# Patient Record
Sex: Male | Born: 1990 | Race: White | Hispanic: No | Marital: Married
Health system: Midwestern US, Community
[De-identification: ages and names within clinical notes are randomized; demographics above are authoritative.]

## PROBLEM LIST (undated history)

## (undated) HISTORY — PX: PELVIC FRACTURE SURGERY: SHX119

---
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
## 2014-12-09 ENCOUNTER — Inpatient Hospital Stay
Admit: 2014-12-09 | Discharge: 2014-12-10 | Disposition: A | Discharge status: Home / Self Care | Payer: BLUE CROSS/BLUE SHIELD | Attending: Emergency Medicine

## 2014-12-09 DIAGNOSIS — S39012A Strain of muscle, fascia and tendon of lower back, initial encounter: Secondary | ICD-10-CM

## 2014-12-09 MED ORDER — IBUPROFEN 600 MG TAB
600 mg | ORAL_TABLET | Freq: Four times a day (QID) | ORAL | Status: AC | PRN
Start: 2014-12-09 — End: ?

## 2014-12-09 MED ORDER — KETOROLAC TROMETHAMINE 60 MG/2 ML IM
60 mg/2 mL | INTRAMUSCULAR | Status: AC
Start: 2014-12-09 — End: 2014-12-09
  Administered 2014-12-09: 23:00:00 via INTRAMUSCULAR

## 2014-12-09 MED ORDER — CHLORZOXAZONE 500 MG TAB
500 mg | ORAL_TABLET | Freq: Three times a day (TID) | ORAL | Status: AC | PRN
Start: 2014-12-09 — End: ?

## 2014-12-09 MED ORDER — OXYCODONE-ACETAMINOPHEN 5 MG-325 MG TAB
5-325 mg | ORAL_TABLET | ORAL | Status: AC | PRN
Start: 2014-12-09 — End: ?

## 2014-12-09 MED ORDER — DIAZEPAM 5 MG/ML SYRINGE
5 mg/mL | INTRAMUSCULAR | Status: AC
Start: 2014-12-09 — End: 2014-12-09
  Administered 2014-12-09: 23:00:00 via INTRAMUSCULAR

## 2014-12-09 MED ORDER — HYDROMORPHONE 2 MG/ML INJECTION SOLUTION
2 mg/mL | INTRAMUSCULAR | Status: AC
Start: 2014-12-09 — End: 2014-12-09
  Administered 2014-12-09: 23:00:00 via INTRAMUSCULAR

## 2014-12-09 MED ORDER — ONDANSETRON 4 MG TAB, RAPID DISSOLVE
4 mg | ORAL | Status: AC
Start: 2014-12-09 — End: 2014-12-09
  Administered 2014-12-09: 23:00:00 via ORAL

## 2014-12-09 MED FILL — KETOROLAC TROMETHAMINE 60 MG/2 ML IM: 60 mg/2 mL | INTRAMUSCULAR | Qty: 2

## 2014-12-09 MED FILL — DIAZEPAM 5 MG/ML SYRINGE: 5 mg/mL | INTRAMUSCULAR | Qty: 2

## 2014-12-09 MED FILL — HYDROMORPHONE 2 MG/ML INJECTION SOLUTION: 2 mg/mL | INTRAMUSCULAR | Qty: 1

## 2014-12-09 MED FILL — ONDANSETRON 4 MG TAB, RAPID DISSOLVE: 4 mg | ORAL | Qty: 1

## 2014-12-09 NOTE — ED Notes (Signed)
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 No pmh back problems.  [place]

## 2014-12-09 NOTE — ED Notes (Signed)
Sepsis Screening completed    (  )Patient meets SIRS criteria.  ( x )Patient does not meet SIRS criteria.      SIRS Criteria is achieved when two or more of the following are present  ? Temperature < 96.8??F (36??C) or > 100.9??F (38.3??C)  ? Heart Rate > 90 beats per minute  ? Respiratory Rate > 20 beats per minute  ? WBC count > 12,000 or <4,000 or > 10% bands

## 2014-12-09 NOTE — ED Notes (Signed)
Pt hourly rounding competed.  Safety   Pt (x) resting on stretcher with side rails up and call bell in reach.    () in chair    () in parents arms.  Toileting   Pt offered ()Bedpan     ()Assistance to Restroom     ()Urinal  Ongoing Updates  Updated on plan of care and status of test results.  Pain Management  Inquired as to comfort and offered comfort measures:    () warm blankets   () dimmed lights

## 2014-12-09 NOTE — ED Notes (Signed)
Assumed care of pt at this time.

## 2014-12-09 NOTE — ED Notes (Signed)
Patient armband removed and shredded  I have reviewed discharge instructions with the patient.  The patient verbalized understanding.

## 2014-12-09 NOTE — ED Provider Notes (Signed)
HPI Comments: 6:16 PM   Mathew Wall is a 24 y.o. male presenting to the ED c/o constant, aching, lower back pain onset between 0800 and 0900 rated 8/10. 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 This morning he picked up a steel rod and felt a pulling sensation. Reports he was able to complete his day at work but his pain has worsened throughout the day. Pain worse with movement. Pt has tried cold compress with no relief. Reports hx of orthopaedic surgery due to fx pelvis, with screws in his pelvis. Denies hx of back pain, radiation of pain, bowel or urinary incontinence, any other injuries, and any other sxs or complaints.     Patient is a 24 y.o. male presenting with back pain. The history is provided by the patient.   Back Pain   This is a new problem. The current episode started 6 to 12 hours ago. The problem has been gradually worsening. The problem occurs constantly. The pain is associated with lifting. The pain is present in the lower back. The quality of the pain is described as aching (pulling). The pain does not radiate. The pain is at a severity of 8/10. The symptoms are aggravated by bending and twisting. Pertinent negatives include no fever, no numbness, no abdominal pain, no bowel incontinence, no bladder incontinence, no pelvic pain, no leg pain and no weakness. He has tried ice for the symptoms. The treatment provided no relief.        History reviewed. No pertinent past medical history.    Past Surgical History:   Procedure Laterality Date   ??? Hx orthopaedic       fx pelvis - screws in pelvis         History reviewed. No pertinent family history.    History     Social History   ??? Marital Status: MARRIED     Spouse Name: N/A   ??? Number of Children: N/A   ??? Years of Education: N/A     Occupational History   ??? Not on file.     Social History Main Topics   ??? Smoking status: Current Every Day Smoker   ??? Smokeless tobacco: Not on file    ??? Alcohol Use: No   ??? Drug Use: No   ??? Sexual Activity: Not on file     Other Topics Concern   ??? Not on file     Social History Narrative   ??? No narrative on file         ALLERGIES: Lovenox and Penicillins    Review of Systems   Constitutional: Negative for fever and chills.   Gastrointestinal: Negative for nausea, vomiting, abdominal pain and bowel incontinence.   Genitourinary: Negative for bladder incontinence, difficulty urinating and pelvic pain.   Musculoskeletal: Positive for back pain.   Skin: Negative for color change and rash.   Neurological: Negative for weakness and numbness.       Filed Vitals:    12/09/14 1742 12/09/14 1911 12/09/14 1922   BP: 144/75 116/71 117/77   Pulse: 87 64 66   Temp: 98.1 ??F (36.7 ??C)     Resp: Height:  (1.778 m)     Weight: 77.111 kg (170 lb)     SpO2: 98% 100% 100%            Physical Exam   Constitutional: He is oriented to person, place,  and time. He appears well-developed and well-nourished. No distress.   Caucasian male in NAD. Alert. Appears uncomfortable with movement.    HENT:   Head: Normocephalic and atraumatic.   Right Ear: External ear normal.   Left Ear: External ear normal.   Nose: Nose normal.   Eyes: Conjunctivae are normal. Right eye exhibits no discharge. Left eye exhibits no discharge.   Neck: Normal range of motion.   Cardiovascular: Normal rate, regular rhythm, normal heart sounds and intact distal pulses.  Exam reveals no gallop and no friction rub.    No murmur heard.  Pulmonary/Chest: Effort normal and breath sounds normal. No accessory muscle usage. No tachypnea. No respiratory distress. He has no decreased breath sounds. He has no wheezes. He has no rhonchi. He has no rales.   Musculoskeletal:        Lumbar back: He exhibits decreased range of motion (secondary to pain), tenderness (no midline spinal tenderness, TTP at bilateral lumbar paraspinal muscles), pain and spasm. He exhibits no swelling and no deformity (no step off).         Back:    Neurological: He is alert and oriented to person, place, and time.   Skin: Skin is warm and dry. He is not diaphoretic.   Psychiatric: He has a normal mood and affect. Judgment normal.   Nursing note and vitals reviewed.       RESULTS:    No orders to display        Labs Reviewed - No data to display    No results found for this or any previous visit (from the past 12 hour(s)).    MDM  Number of Diagnoses or Management Options  Low back strain, initial encounter:   Diagnosis management comments: CC of back pain that is reproducible on exam w/ movement/ROM/palpation. No abdominal complaints/abdomen non tender on exam. No pulsatile mass appreciated. Normal neurologic exam. Not immunosupressed. Doubt epidural abscess, infection, neoplastic etiology. Not consistent with cauda equina. No trauma or midline tenderness. Doubt fracture. Most c/w musculoskeletal etiology. The patient shows no signs or symptoms of developing complication requiring inpatient treatment or management. Further laboratory or radiological investigation is not indicated. Will treat symptomatically with return immediately for new or worsening symptoms. The importance of close follow-up with PMD emphasized to patient.        MEDICATIONS GIVEN:  Medications   ketorolac tromethamine (TORADOL) 60 mg/2 mL injection 60 mg (60 mg IntraMUSCular Given 12/09/14 1838)   diazepam (VALIUM) injection 5 mg (5 mg IntraMUSCular Given 12/09/14 1839)   HYDROmorphone (DILAUDID) injection 2 mg (2 mg IntraMUSCular Given 12/09/14 1912)   ondansetron (ZOFRAN ODT) tablet 4 mg (4 mg Oral Given 12/09/14 1838)       Procedures    PROGRESS NOTE:  6:16 PM  Initial assessment performed.    PROGRESS NOTE:   7:26 PM  Pt has been re-examined by Cassandria Anger, PA-C. Pt's pain has improved, he is listening to music. Will d/c home.     See MDM above. Reasons to RTED discussed with pt. All questions answered. Pt feels comfortable going home at this time. Pt expressed understanding  and he agrees with plan.      DISCHARGE NOTE:  7:29 PM   Mathew Wall's  results have been reviewed with him.  He has been counseled regarding his diagnosis, treatment, and plan.  He verbally conveys understanding and agreement of the signs, symptoms, diagnosis, treatment and prognosis and additionally agrees to follow up as discussed.  He also agrees with the care-plan and conveys that all of his questions have been answered.  I have also provided discharge instructions for him that include: educational information regarding their diagnosis and treatment, and list of reasons why they would want to return to the ED prior to their follow-up appointment, should his condition change. The patient has been provided with education for proper Emergency Department utilization.    CLINICAL IMPRESSION:    1. Low back strain, initial encounter        PLAN: DISCHARGE HOME    Follow-up Information     Follow up With Details Comments Contact Info    Esperanza Richters, DO Schedule an appointment as soon as possible for a visit in 2 days As needed, for follow up after ED visit [address] [place] [address]  [phone]      [facility] EMERGENCY DEPT Go to As needed, If symptoms worsen [address]  [place] [address]  [phone]          Current Discharge Medication List      START taking these medications    Details   ibuprofen (MOTRIN) 600 mg tablet Take 1 Tab by mouth every six (6) hours as needed for Pain. Take with food.  Qty: 20 Tab, Refills: 0      chlorzoxazone (PARAFON FORTE DSC) 500 mg tablet Take 1 Tab by mouth three (3) times daily as needed for Muscle Spasm(s). Indications: Please do not drive while taking this medication.  Qty: 21 Tab, Refills: 0      oxyCODONE-acetaminophen (PERCOCET) 5-325 mg per tablet Take 1 Tab by mouth every four (4) hours as needed for Pain. Max Daily Amount: 6 Tabs. Indications: Please do not drive while taking this medication.  Qty: 20 Tab, Refills: 0                  SCRIBE ATTESTATION STATEMENT  Documented [id number] Manson Passey, scribing for and in the presence of Cassandria Anger, PA-C.     PROVIDER ATTESTATION STATEMENT  I personally performed the services described in the documentation, reviewed the documentation, as recorded by the scribe in my presence, and it accurately and completely records my words and actions.  Cassandria Anger, PA-C.

## 2014-12-12 ENCOUNTER — Emergency Department (HOSPITAL_BASED_OUTPATIENT_CLINIC_OR_DEPARTMENT_OTHER)
Admission: EM | Admit: 2014-12-12 | Discharge: 2014-12-12 | Disposition: A | Discharge status: Home / Self Care | Payer: BLUE CROSS/BLUE SHIELD | Attending: Emergency Medicine | Admitting: Emergency Medicine

## 2014-12-12 ENCOUNTER — Encounter (HOSPITAL_BASED_OUTPATIENT_CLINIC_OR_DEPARTMENT_OTHER): Payer: Self-pay | Admitting: *Deleted

## 2014-12-12 DIAGNOSIS — M545 Low back pain, unspecified: Secondary | ICD-10-CM

## 2014-12-12 DIAGNOSIS — M549 Dorsalgia, unspecified: Secondary | ICD-10-CM

## 2014-12-12 DIAGNOSIS — Z720 Tobacco use: Secondary | ICD-10-CM | POA: Diagnosis not present

## 2014-12-12 DIAGNOSIS — Z880 Allergy status to penicillin: Secondary | ICD-10-CM | POA: Diagnosis not present

## 2014-12-12 DIAGNOSIS — M62830 Muscle spasm of back: Secondary | ICD-10-CM | POA: Insufficient documentation

## 2014-12-12 MED ORDER — HYDROCODONE-ACETAMINOPHEN 5-325 MG PO TABS: 1.0000 | ORAL_TABLET | Freq: Four times a day (QID) | ORAL | Status: DC | PRN

## 2014-12-12 MED ORDER — NAPROXEN 500 MG PO TABS: 500.0000 mg | ORAL_TABLET | Freq: Two times a day (BID) | ORAL | Status: DC

## 2014-12-12 MED ORDER — HYDROMORPHONE HCL 1 MG/ML IJ SOLN
1.0000 mg | Freq: Once | INTRAMUSCULAR | Status: DC
  Filled 2014-12-12: qty 1

## 2014-12-12 MED ORDER — KETOROLAC TROMETHAMINE 60 MG/2ML IM SOLN
60.0000 mg | Freq: Once | INTRAMUSCULAR | Status: AC
  Administered 2014-12-12: 60 mg via INTRAMUSCULAR
  Filled 2014-12-12: qty 2

## 2014-12-12 MED ORDER — CYCLOBENZAPRINE HCL 10 MG PO TABS: 10.0000 mg | ORAL_TABLET | Freq: Two times a day (BID) | ORAL | Status: DC | PRN

## 2014-12-12 MED ORDER — CYCLOBENZAPRINE HCL 10 MG PO TABS
10.0000 mg | ORAL_TABLET | Freq: Once | ORAL | Status: DC
  Filled 2014-12-12: qty 1

## 2014-12-12 NOTE — Discharge Instructions (Signed)

## 2014-12-12 NOTE — ED Notes (Signed)
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 C/o low bil back pain.

## 2014-12-12 NOTE — ED Provider Notes (Signed)
CSN: [id number]     Arrival date & time 12/12/14  1708 History   First MD Initiated Contact with Patient 12/12/14 1724     No chief complaint on file.    (Consider location/radiation/quality/duration/timing/severity/associated sxs/prior Treatment) Patient is a 24 y.o. male presenting with back pain.  Back Pain Location:  Lumbar spine Quality:  Shooting Pain severity:  Severe Onset quality:  Sudden Duration:  6 hours Timing:  Constant Progression:  Worsening Context comment:  Bent down to pick something up then went up and had pain Relieved by:  Nothing Worsened by:  Standing and movement Ineffective treatments: goody powder, tylenol. Associated symptoms: no abdominal pain, no bladder incontinence, no bowel incontinence, no chest pain, no dysuria, no fever, no headaches, no numbness, no perianal numbness, no tingling and no weakness   Risk factors: no hx of cancer and no steroid use   Risk factors comment:  No ivdu, no fall   History reviewed. No pertinent past medical history. History reviewed. No pertinent past surgical history. No family history on file. History  Substance Use Topics  . Smoking status: Current Every Day Smoker -- 1.00 packs/day    Types: Cigarettes  . Smokeless tobacco: Not on file  . Alcohol Use: Not on file    Review of Systems  Constitutional: Negative for fever.  HENT: Negative for sore throat.   Eyes: Negative for visual disturbance.  Respiratory: Negative for shortness of breath.   Cardiovascular: Negative for chest pain.  Gastrointestinal: Negative for abdominal pain and bowel incontinence.  Genitourinary: Negative for bladder incontinence, dysuria and difficulty urinating.  Musculoskeletal: Positive for back pain. Negative for neck stiffness.  Skin: Negative for rash.  Neurological: Negative for tingling, syncope, weakness, numbness and headaches.      Allergies  Penicillins and Lovenox  Home Medications   Prior to Admission  medications   Not on File   BP 126/70 mmHg  Pulse 65  Temp(Src) 98.4 F (36.9 C) (Oral)  Resp 18  Ht 5\' 10"  (1.778 m)  Wt 170 lb (77.111 kg)  BMI 24.39 kg/m2  SpO2 100% Physical Exam  Constitutional: He is oriented to person, place, and time. He appears well-developed and well-nourished. No distress.  HENT:  Head: Normocephalic and atraumatic.  Eyes: Conjunctivae and EOM are normal.  Neck: Normal range of motion.  Cardiovascular: Normal rate, regular rhythm, normal heart sounds and intact distal pulses.  Exam reveals no gallop and no friction rub.   No murmur heard. Pulmonary/Chest: Effort normal and breath sounds normal. No respiratory distress. He has no wheezes. He has no rales.  Abdominal: Soft. He exhibits no distension. There is no tenderness. There is no guarding.  Musculoskeletal: He exhibits no edema.       Cervical back: He exhibits no tenderness.       Thoracic back: He exhibits no tenderness.       Lumbar back: He exhibits tenderness (bilateral lower back) and spasm.  Neurological: He is alert and oriented to person, place, and time. He has normal strength. No sensory deficit. GCS eye subscore is 4. GCS verbal subscore is 5. GCS motor subscore is 6.  Skin: Skin is warm and dry. He is not diaphoretic.  Nursing note and vitals reviewed.   ED Course  Procedures (including critical care time) Labs Review Labs Reviewed - No data to display  Imaging Review No results found.   EKG Interpretation None      MDM   Final diagnoses:  None  24yo male with no significant medical history presents with acute onset of lower back pain after lifting something at work. Patient has a normal neurologic exam and denies any urinary retention or overflow incontinence, stool incontinence, saddle anesthesia, fever, IV drug use, trauma, chronic steroid use or immunocompromise and have low suspicion suspicion for cauda equina, fracture, epidural abscess, or vertebral osteomyelitis.   Pt most likely with muscular sprain based on hx and physical. Given toradol in ED and rx for flexeril, norco and naprosyn. Patient discharged in stable condition with understanding of reasons to return.     Alvira Monday, MD 12/13/14 2300

## 2015-02-16 ENCOUNTER — Encounter (HOSPITAL_BASED_OUTPATIENT_CLINIC_OR_DEPARTMENT_OTHER): Payer: Self-pay | Admitting: *Deleted

## 2015-02-16 ENCOUNTER — Emergency Department (HOSPITAL_BASED_OUTPATIENT_CLINIC_OR_DEPARTMENT_OTHER)
Admission: EM | Admit: 2015-02-16 | Discharge: 2015-02-16 | Discharge status: Against Medical Advice | Payer: BLUE CROSS/BLUE SHIELD | Attending: Emergency Medicine | Admitting: Emergency Medicine

## 2015-02-16 DIAGNOSIS — S3992XA Unspecified injury of lower back, initial encounter: Secondary | ICD-10-CM | POA: Insufficient documentation

## 2015-02-16 DIAGNOSIS — Y998 Other external cause status: Secondary | ICD-10-CM | POA: Diagnosis not present

## 2015-02-16 DIAGNOSIS — Y9389 Activity, other specified: Secondary | ICD-10-CM | POA: Diagnosis not present

## 2015-02-16 DIAGNOSIS — Z720 Tobacco use: Secondary | ICD-10-CM | POA: Diagnosis not present

## 2015-02-16 DIAGNOSIS — Y9289 Other specified places as the place of occurrence of the external cause: Secondary | ICD-10-CM | POA: Diagnosis not present

## 2015-02-16 DIAGNOSIS — X58XXXA Exposure to other specified factors, initial encounter: Secondary | ICD-10-CM | POA: Diagnosis not present

## 2015-02-16 NOTE — ED Notes (Signed)
States he was picking up a piece of steel today and felt a pop in his back. Here for tx of back pain.

## 2015-02-16 NOTE — ED Notes (Addendum)
Called for pt X1. Pt is not in waiting room at this time.

## 2015-02-16 NOTE — ED Notes (Signed)
Pt not in waiting room X2  

## 2015-02-17 ENCOUNTER — Encounter (HOSPITAL_BASED_OUTPATIENT_CLINIC_OR_DEPARTMENT_OTHER): Payer: Self-pay | Admitting: *Deleted

## 2015-02-17 ENCOUNTER — Emergency Department (HOSPITAL_BASED_OUTPATIENT_CLINIC_OR_DEPARTMENT_OTHER)
Admission: EM | Admit: 2015-02-17 | Discharge: 2015-02-17 | Disposition: A | Discharge status: Home / Self Care | Payer: BLUE CROSS/BLUE SHIELD | Attending: Emergency Medicine | Admitting: Emergency Medicine

## 2015-02-17 ENCOUNTER — Emergency Department (HOSPITAL_BASED_OUTPATIENT_CLINIC_OR_DEPARTMENT_OTHER): Payer: BLUE CROSS/BLUE SHIELD

## 2015-02-17 DIAGNOSIS — S39012A Strain of muscle, fascia and tendon of lower back, initial encounter: Secondary | ICD-10-CM | POA: Diagnosis not present

## 2015-02-17 DIAGNOSIS — Y9389 Activity, other specified: Secondary | ICD-10-CM | POA: Diagnosis not present

## 2015-02-17 DIAGNOSIS — Y9289 Other specified places as the place of occurrence of the external cause: Secondary | ICD-10-CM | POA: Diagnosis not present

## 2015-02-17 DIAGNOSIS — Z791 Long term (current) use of non-steroidal anti-inflammatories (NSAID): Secondary | ICD-10-CM | POA: Insufficient documentation

## 2015-02-17 DIAGNOSIS — Z720 Tobacco use: Secondary | ICD-10-CM | POA: Insufficient documentation

## 2015-02-17 DIAGNOSIS — X58XXXA Exposure to other specified factors, initial encounter: Secondary | ICD-10-CM | POA: Diagnosis not present

## 2015-02-17 DIAGNOSIS — Y998 Other external cause status: Secondary | ICD-10-CM | POA: Diagnosis not present

## 2015-02-17 DIAGNOSIS — Z880 Allergy status to penicillin: Secondary | ICD-10-CM | POA: Diagnosis not present

## 2015-02-17 DIAGNOSIS — S3992XA Unspecified injury of lower back, initial encounter: Secondary | ICD-10-CM | POA: Diagnosis present

## 2015-02-17 MED ORDER — HYDROCODONE-ACETAMINOPHEN 5-325 MG PO TABS
2.0000 | ORAL_TABLET | Freq: Once | ORAL | Status: AC
  Administered 2015-02-17: 2 via ORAL
  Filled 2015-02-17: qty 2

## 2015-02-17 MED ORDER — NAPROXEN 500 MG PO TABS: 500.0000 mg | ORAL_TABLET | Freq: Two times a day (BID) | ORAL | Status: AC

## 2015-02-17 MED ORDER — CYCLOBENZAPRINE HCL 10 MG PO TABS
5.0000 mg | ORAL_TABLET | Freq: Once | ORAL | Status: AC
  Administered 2015-02-17: 5 mg via ORAL
  Filled 2015-02-17: qty 1

## 2015-02-17 MED ORDER — CYCLOBENZAPRINE HCL 10 MG PO TABS: 10.0000 mg | ORAL_TABLET | Freq: Two times a day (BID) | ORAL | Status: AC | PRN

## 2015-02-17 MED ORDER — IBUPROFEN 800 MG PO TABS
800.0000 mg | ORAL_TABLET | Freq: Once | ORAL | Status: AC
Start: 2015-02-17 — End: 2015-02-17
  Administered 2015-02-17: 800 mg via ORAL
  Filled 2015-02-17: qty 1

## 2015-02-17 MED ORDER — HYDROCODONE-ACETAMINOPHEN 5-325 MG PO TABS
1.0000 | ORAL_TABLET | Freq: Four times a day (QID) | ORAL | Status: AC | PRN
Start: 2015-02-17 — End: ?

## 2015-02-17 NOTE — ED Notes (Addendum)
Back pain. No improvement since yesterday. He left before being seen yesterday.

## 2015-02-17 NOTE — ED Provider Notes (Signed)
CSN: [id number]     Arrival date & time 02/17/15  1246 History   First MD Initiated Contact with Patient 02/17/15 1321     Chief Complaint  Patient presents with  . Back Pain     (Consider location/radiation/quality/duration/timing/severity/associated sxs/prior Treatment) The history is provided by the patient.  Gary Ellison is a 24 y.o. male here with back pain.  Patient states that he picked up a piece of steel yesterday and felt up pop in his back.  He denies severe pain in his back.   Denies any trouble walking or weakness.  Denies any urinary symptoms.  Denies any fall or injury to the back. Of note, patient seen here in August for back pain after lifting heavy items.  He was prescribed Flexeril, Norco, Naprosyn and felt better.  He did follow up with primary care doctor but didn't have any further imaging of the time.   History reviewed. No pertinent past medical history. Past Surgical History  Procedure Laterality Date  . Pelvic fracture surgery     No family history on file. Social History  Substance Use Topics  . Smoking status: Current Every Day Smoker -- 1.00 packs/day    Types: Cigarettes  . Smokeless tobacco: None  . Alcohol Use: None    Review of Systems  Musculoskeletal: Positive for back pain.  All other systems reviewed and are negative.     Allergies  Penicillins and Lovenox  Home Medications   Prior to Admission medications   Medication Sig Start Date End Date Taking? Authorizing Provider  cyclobenzaprine (FLEXERIL) 10 MG tablet Take 1 tablet (10 mg total) by mouth 2 (two) times daily as needed for muscle spasms. 12/12/14   Alvira Monday, MD  HYDROcodone-acetaminophen (NORCO/VICODIN) 5-325 MG per tablet Take 1 tablet by mouth every 6 (six) hours as needed. 12/12/14   Alvira Monday, MD  naproxen (NAPROSYN) 500 MG tablet Take 1 tablet (500 mg total) by mouth 2 (two) times daily. 12/12/14   Alvira Monday, MD   BP 127/75 mmHg  Pulse 69  Temp(Src) 98.1  F (36.7 C) (Oral)  Resp 20  Ht  (1.753 m)  Wt 175 lb (79.379 kg)  BMI 25.83 kg/m2  SpO2 100% Physical Exam  Constitutional: He is oriented to person, place, and time.  Uncomfortable   HENT:  Head: Normocephalic.  Mouth/Throat: Oropharynx is clear and moist.  Eyes: Conjunctivae are normal. Pupils are equal, round, and reactive to light.  Neck: Normal range of motion.  Cardiovascular: Normal rate, regular rhythm and normal heart sounds.   Pulmonary/Chest: Effort normal and breath sounds normal. No respiratory distress. He has no wheezes. He has no rales.  Abdominal: Soft. Bowel sounds are normal. He exhibits no distension. There is no tenderness. There is no rebound.  Musculoskeletal: Normal range of motion.  Mild lower lumbar tenderness, + bilateral paralumbar spasms.   Neurological: He is alert and oriented to person, place, and time.  No saddle anesthesia. Neg straight leg raise.   Skin: Skin is warm and dry.  Psychiatric: He has a normal mood and affect. His behavior is normal. Judgment and thought content normal.  Nursing note and vitals reviewed.   ED Course  Procedures (including critical care time) Labs Review Labs Reviewed - No data to display  Imaging Review Dg Lumbar Spine Complete  02/17/2015   CLINICAL DATA:  Lumbago following lifting injury  EXAM: LUMBAR SPINE - COMPLETE 4+ VIEW  COMPARISON:  None.  FINDINGS: Frontal, lateral, spot lumbosacral  lateral, and bilateral oblique views were obtained. There are 5 non-rib-bearing lumbar type vertebral bodies. T12 ribs are markedly hypoplastic. There is screw fixation through both sacroiliac joints traversing the sacrum. Alignment is anatomic these areas. There is no acute fracture or spondylolisthesis. Disc spaces appear intact. There is no appreciable facet arthropathy. Coils are present in the left upper quadrant.  IMPRESSION: Screw fixation through both sacroiliac joints with alignment anatomic. No acute fracture or  spondylolisthesis. No appreciable arthropathic change.   Electronically Signed   By: Bretta Bang III M.D.   On: 02/17/2015 14:06   I have personally reviewed and evaluated these images and lab results as part of my medical decision-making.   EKG Interpretation None      MDM   Final diagnoses:  None   Gary Ellison is a 24 y.o. male here with back pain s/p picking up heavy item. Neurovascular intact. Likely disc protrusion vs muscle spasms. Will not need MRI. Will get xrays, give motrin, flexeril, vicodin.   2:45 PM Xray showed previous screws in place. Pain improved. Vitals stable. Will dc home with flexeril, motrin, vicodin. Recommend f/u outpatient for MRI outpatient.     Richardean Canal, MD 02/17/15 [phone]

## 2015-02-17 NOTE — Discharge Instructions (Signed)
Take naprosyn for pain.   Take flexeril for muscle spasms.   Take vicodin for severe pain.   Rest for 3-4 days.   See your doctor. You may need MRI if your back pain is not improved.   Return to ER if you have worse back pain, unable to walk, numbness or weakness, trouble urinating.

## 2015-09-08 ENCOUNTER — Emergency Department
Admission: EM | Admit: 2015-09-08 | Discharge: 2015-09-08 | Disposition: A | Discharge status: Home / Self Care | Payer: Charity | Attending: Emergency Medicine | Admitting: Emergency Medicine

## 2015-09-08 ENCOUNTER — Emergency Department: Payer: Self-pay

## 2015-09-08 DIAGNOSIS — F17200 Nicotine dependence, unspecified, uncomplicated: Secondary | ICD-10-CM | POA: Insufficient documentation

## 2015-09-08 DIAGNOSIS — J039 Acute tonsillitis, unspecified: Secondary | ICD-10-CM | POA: Insufficient documentation

## 2015-09-08 LAB — GROUP A STREP, RAPID ANTIGEN: Group A Strep, Rapid Antigen: NEGATIVE

## 2015-09-08 MED ORDER — IBUPROFEN 600 MG PO TABS
600.0000 mg | ORAL_TABLET | Freq: Once | ORAL | Status: AC
Start: 2015-09-08 — End: 2015-09-08
  Administered 2015-09-08: 600 mg via ORAL
  Filled 2015-09-08: qty 1

## 2015-09-08 MED ORDER — DEXAMETHASONE SODIUM PHOSPHATE 4 MG/ML IJ SOLN (WRAP)
10.0000 mg | Freq: Once | INTRAMUSCULAR | Status: AC
Start: 2015-09-08 — End: 2015-09-08
  Administered 2015-09-08: 10 mg via ORAL
  Filled 2015-09-08: qty 5

## 2015-09-08 MED ORDER — AMOXICILLIN-POT CLAVULANATE 875-125 MG PO TABS
1.0000 | ORAL_TABLET | Freq: Once | ORAL | Status: AC
Start: 2015-09-08 — End: 2015-09-08
  Administered 2015-09-08: 1 via ORAL
  Filled 2015-09-08: qty 1

## 2015-09-08 MED ORDER — NAPROXEN 250 MG PO TABS
500.0000 mg | ORAL_TABLET | Freq: Two times a day (BID) | ORAL | Status: AC
Start: 2015-09-08 — End: ?

## 2015-09-08 MED ORDER — AMOXICILLIN-POT CLAVULANATE 875-125 MG PO TABS
1.0000 | ORAL_TABLET | Freq: Two times a day (BID) | ORAL | Status: AC
Start: 2015-09-08 — End: 2015-09-18

## 2015-09-08 MED ORDER — ACETAMINOPHEN 500 MG PO TABS
1000.0000 mg | ORAL_TABLET | Freq: Once | ORAL | Status: AC
Start: 2015-09-08 — End: 2015-09-08
  Administered 2015-09-08: 1000 mg via ORAL
  Filled 2015-09-08: qty 2

## 2015-09-08 NOTE — ED Provider Notes (Signed)
EMERGENCY DEPARTMENT NOTE    Physician/Midlevel provider first contact with patient: 09/08/15 1833         HISTORY OF PRESENT ILLNESS   Historian:Patient  Translator Used: No    Chief Complaint: Sore Throat     25 y.o. male with c/o sore throat for the past 2 days.  Reports left worse than right, pain radiates to left ear. No known contact with others with strep. No recent illness or travel. Denies fever, ear pain, cough, or congestion.     1. Location of symptoms: throat  2. Onset of symptoms: 2 days ago  3. What was patient doing when symptoms started (Context): see above  4. Severity: moderate  5. Timing: constant  6. Activities that worsen symptoms: swallowing  7. Activities that improve symptoms: nothing tried  8. Quality:sharp, achy  9. Radiation of symptoms: no  10. Associated signs and Symptoms: see above  11. Are symptoms worsening? yes  MEDICAL HISTORY     Past Medical History:  History reviewed. No pertinent past medical history.    Past Surgical History:  History reviewed. No pertinent past surgical history.    Social History:  Social History     Social History   . Marital Status: Single     Spouse Name: N/A   . Number of Children: N/A   . Years of Education: N/A     Occupational History   . Not on file.     Social History Main Topics   . Smoking status: Current Every Day Smoker   . Smokeless tobacco: Never Used   . Alcohol Use: No   . Drug Use: Yes     Special: Marijuana   . Sexual Activity: Not on file     Other Topics Concern   . Not on file     Social History Narrative   . No narrative on file       Family History:  History reviewed. No pertinent family history.    Outpatient Medication:  Discharge Medication List as of 09/08/2015  6:58 PM            REVIEW OF SYSTEMS   Review of Systems   Constitutional: Negative for fever and chills.   HENT: Positive for ear pain (left ) and sore throat.    Respiratory: Negative for cough.    Cardiovascular: Negative for chest pain.   Gastrointestinal: Negative.     Genitourinary: Negative.    Musculoskeletal: Negative.    Skin: Negative for rash.   Neurological: Negative for headaches.   All other systems reviewed and are negative.       PHYSICAL EXAM   ED Triage Vitals   Enc Vitals Group      BP 09/08/15 1806 156/97 mmHg      Heart Rate 09/08/15 1806 87      Resp Rate 09/08/15 1806 16      Temp 09/08/15 1806 98.8 F (37.1 C)      Temp Source 09/08/15 1806 Oral      SpO2 09/08/15 1806 97 %      Weight 09/08/15 1812 103.602 kg      Height --       Head Cir --       Peak Flow --       Pain Score --       Pain Loc --       Pain Edu? --       Excl. in GC? --  Nursing note and vitals reviewed.  Constitutional: Well developed, well nourished. No distress noted.  Head: Atraumatic. Normocephalic.   Eyes: PERRL. EOMI. Conjunctivae are not pale.  Ears:   Right TM is normal.  Left TM is normal. No erythema. No loss of landmarks or bulging.  Normal light reflex. No effusion or air-fluid levels.  No perforation.   Nose:  Nares are patent. Turbinates are normal.  No rhinorrhea  Throat:  Mucous membranes are moist.   Posterior pharynx is symmetric with moderate erythema or exudates.  Tonsils are with edema, no exudate.  Left tonsil +3 right tonsil +1. No uvular shift. Tonsil soft, no obvious areas of induration.   Neck: Supple. Full ROM. No cervical adenopathy.   Cardiovascular: Regular rate. Regular rhythm. No murmurs, rubs, or gallops.  Pulmonary/Chest: No evidence of respiratory distress.Clear to auscultation bilaterally. No wheezing, rales or rhonchi.   Extremities: No edema. No cyanosis. No clubbing. Full range of motion in all extremities.  Skin: Skin is warm and dry. No diaphoresis. No rash.   Neurological: Alert, awake, and appropriate. Normal speech. Motor normal.  Psychiatric: Good eye contact. Normal interaction, affect, and behavior.      MEDICAL DECISION MAKING     DISCUSSION    R/o strep    Rapid strep negative, culture pending. Concern for possible  peritonsillar abscess. Discussed with patient, patient declined imaging. Will return to ED for any worsening symptoms. Will started on Augmentin.  Tylenol and motrin as needed for pain or fever.  Follow up with PCP. Return to ED for any new or worsening symptoms or concerns including shortness of breath, trouble breathing, increased swelling or pain, or fever. Plan of care discussed with patient, patient agreeable.      Vital Signs: Reviewed the patient?s vital signs.   Nursing Notes: Reviewed and utilized available nursing notes.  Medical Records Reviewed: Reviewed available past medical records.  Counseling: The emergency provider has spoken with the patient and discussed today?s findings, in addition to providing specific details for the plan of care.  Questions are answered and there is agreement with the plan.        PULSE OXIMETRY    Oxygen Saturation by Pulse Oximetry: 97%  Interventions: none  Interpretation: normal    EMERGENCY DEPT. MEDICATIONS      ED Medication Orders     Start Ordered     Status Ordering Provider    09/08/15 1857 09/08/15 1856  amoxicillin-clavulanate (AUGMENTIN) 875-125 MG per tablet 1 tablet   Once     Route: Oral  Ordered Dose: 1 tablet     Last MAR action:  Given [name]    09/08/15 1839 09/08/15 1838  ibuprofen (ADVIL,MOTRIN) tablet 600 mg   Once     Route: Oral  Ordered Dose: 600 mg     Last MAR action:  Given [name]    09/08/15 1839 09/08/15 1838  dexamethasone (DECADRON) injection 10 mg   Once     Route: Oral  Ordered Dose: 10 mg     Last MAR action:  Given [name]    09/08/15 1839 09/08/15 1838  acetaminophen (TYLENOL) tablet 1,000 mg   Once     Route: Oral  Ordered Dose: 1,000 mg     Last MAR action:  Given [name]          LABORATORY RESULTS    Ordered and independently interpreted AVAILABLE laboratory tests. Please see results section in chart for  full details.  Results for orders placed or performed during the hospital encounter  of 09/08/15   Rapid Strep   Result Value Ref Range    Group A Strep, Rapid Antigen Negative Negative         DIAGNOSIS      Diagnosis:  Final diagnoses:   Tonsillitis       Disposition:  ED Disposition     Discharge Rick Craig discharge to home/self care.    Condition at disposition: Stable            Prescriptions:  Discharge Medication List as of 09/08/2015  6:58 PM      START taking these medications    Details   amoxicillin-clavulanate (AUGMENTIN) 875-125 MG per tablet Take 1 tablet by mouth 2 (two) times daily., Starting 09/08/2015, Until Fri 09/18/15, Print      naproxen (NAPROSYN) 250 MG tablet Take 2 tablets (500 mg total) by mouth 2 (two) times daily with meals., Starting 09/08/2015, Until Discontinued, Print                 Daune Perch, NP  09/11/15 [phone]

## 2015-09-08 NOTE — ED Notes (Signed)
Pt reports sore throat x 2 days. Pt denies fever and chills.

## 2015-09-08 NOTE — Discharge Instructions (Signed)
Medications as prescribed.   Throat lozengers as needed for comfort  Warm salt water gargles  Drink plenty of fluids and get rest  Tylenol and Motrin as needed for pain or fever  Follow up with PCP.   Return to ED for any new or worsening symptoms or concerns.     Tonsillitis    You have been diagnosed with tonsillitis.    Tonsillitis is an infection of the tonsils. Most sore throats are caused by viruses and do not require antibiotics. Some sore throats are caused by bacteria and viruses. A test for Strep throat may be used to help in your diagnosis.     Symptoms of tonsillitis may include fever (temperature higher than 100.6F / 38C), sore throat, and a hoarse voice. If you have cold symptoms such as sneezing and coughing, runny nose, or congestion, your sore throat is more likely to be caused by a virus and not bacteria.    Whether your sore throat is caused by a virus or bacteria, you may need medication for pain and fever. You should also drink a lot of fluid. If your sore throat is caused by bacteria, you will also need antibiotics. If your sore throat is caused by a virus, you do not need antibiotics. Antibiotics will not kill the virus and they may cause side-effects, like diarrhea, abdominal cramps, or allergic reactions. Taking an antibiotic that you do not need may cause "resistance," meaning that antibiotic won't work in the future when you have a true bacterial infection. The doctor will decide if antibiotics are needed.    YOU SHOULD SEEK MEDICAL ATTENTION IMMEDIATELY, EITHER HERE OR AT THE NEAREST EMERGENCY DEPARTMENT, IF ANY OF THE FOLLOWING OCCURS:   You have difficulty breathing.   Your voices changes or seems muffled.   You have trouble swallowing.   You have fever (temperature higher than 100.6F / 38C) that won't go away.   You feel worse or do not improve after 2 to 3 days.

## 2016-05-24 IMAGING — CR DG LUMBAR SPINE COMPLETE 4+V
5 series · 5 of 5 positions shown · non-contrast
Comparison: None.

CLINICAL DATA: Lumbago following lifting injury

EXAM:
LUMBAR SPINE - COMPLETE 4+ VIEW

[t l-spine a.p.]
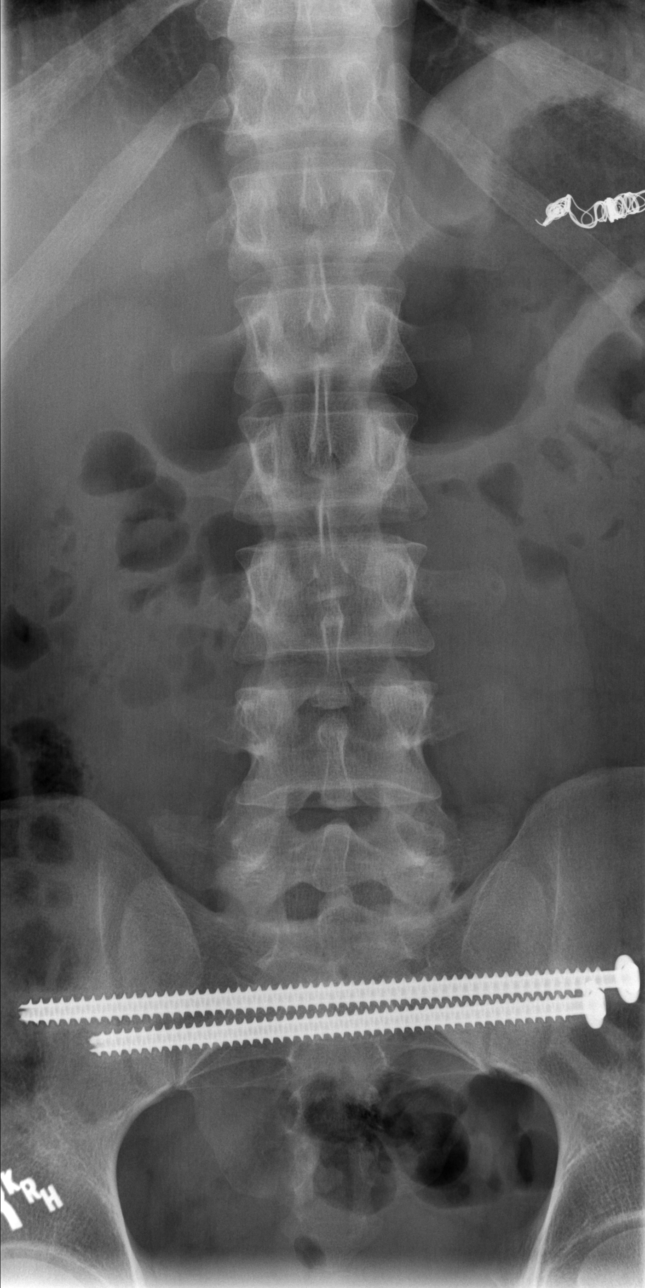

[t l-spine oblique exposure (1 of 2)]
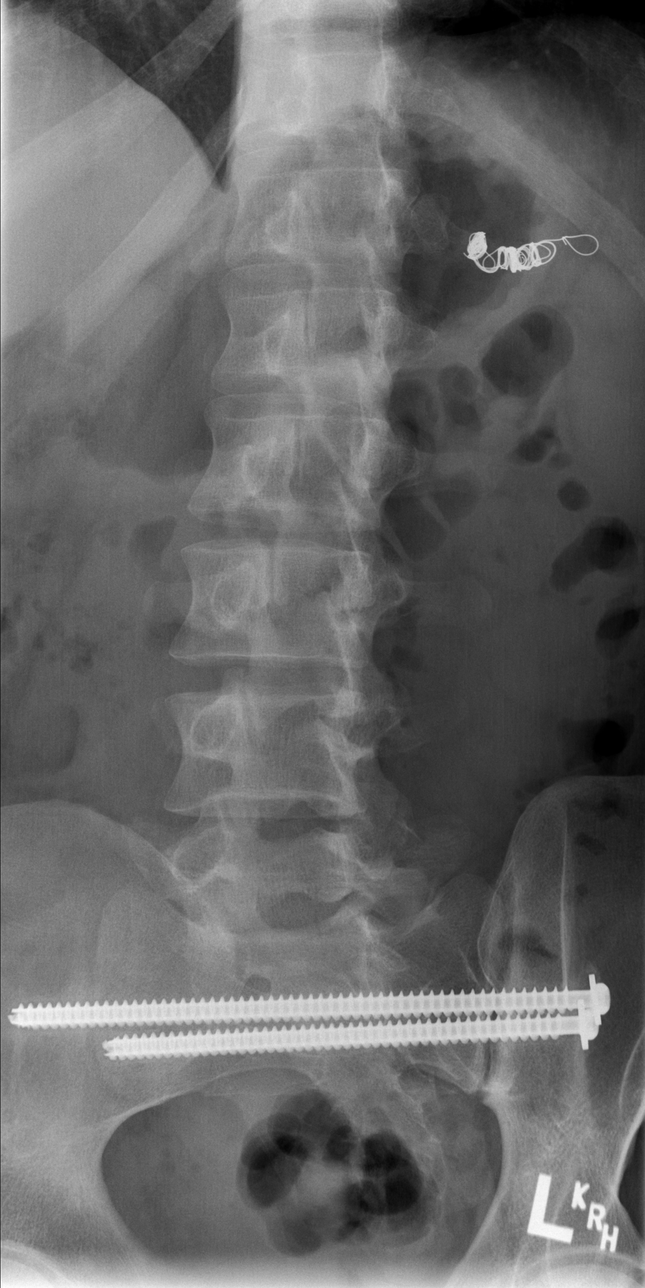

[t l-spine oblique exposure (2 of 2)]
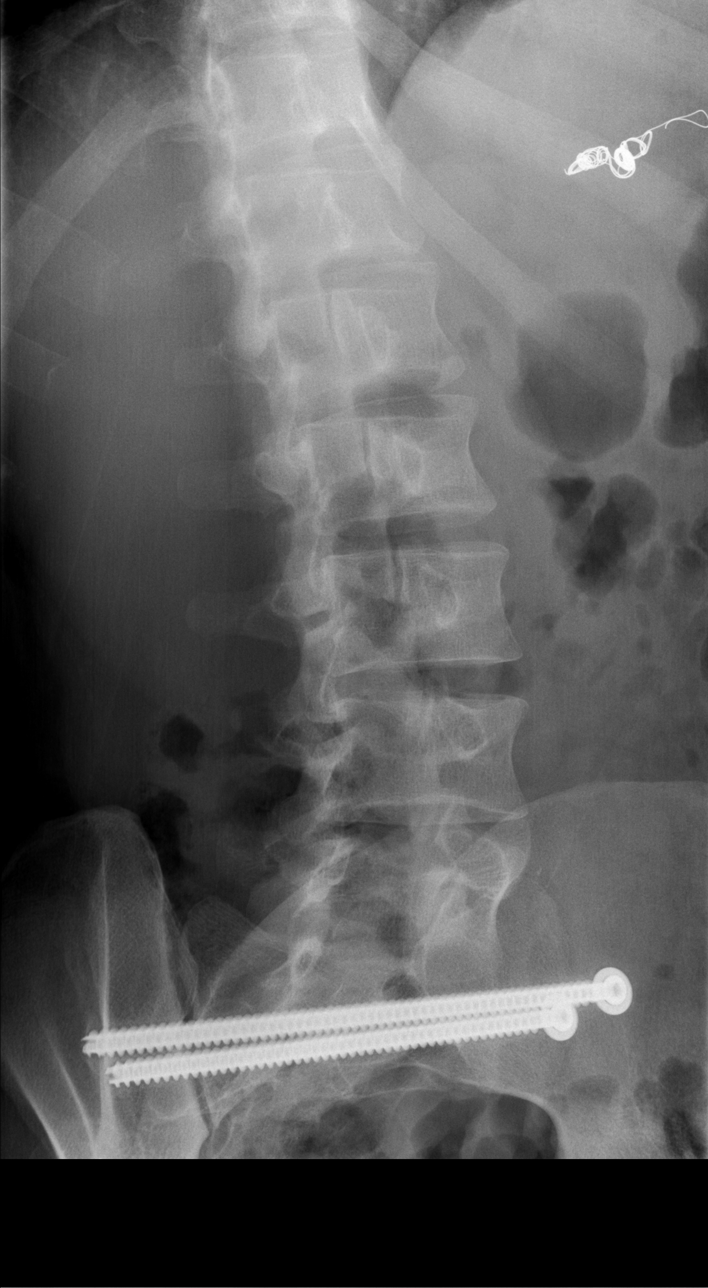

[t l-spine lat]
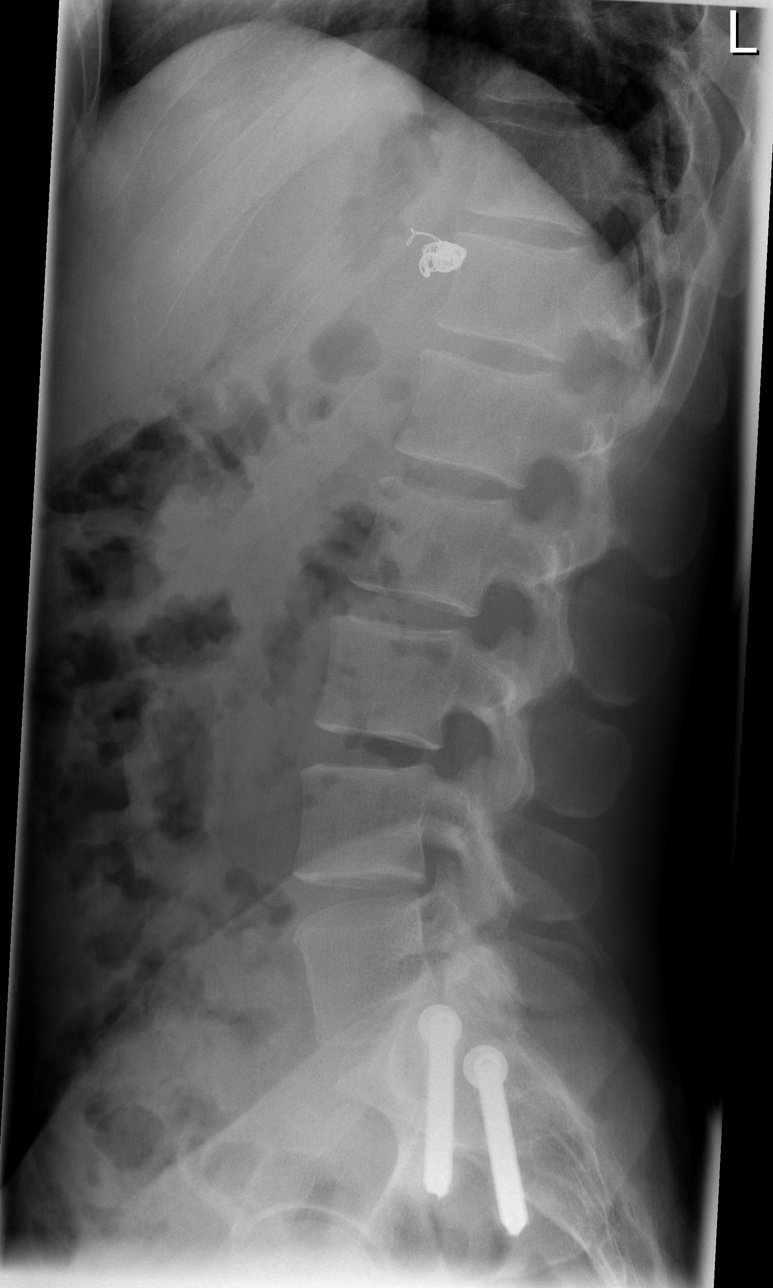

[t l-spine l5-s1 spot]
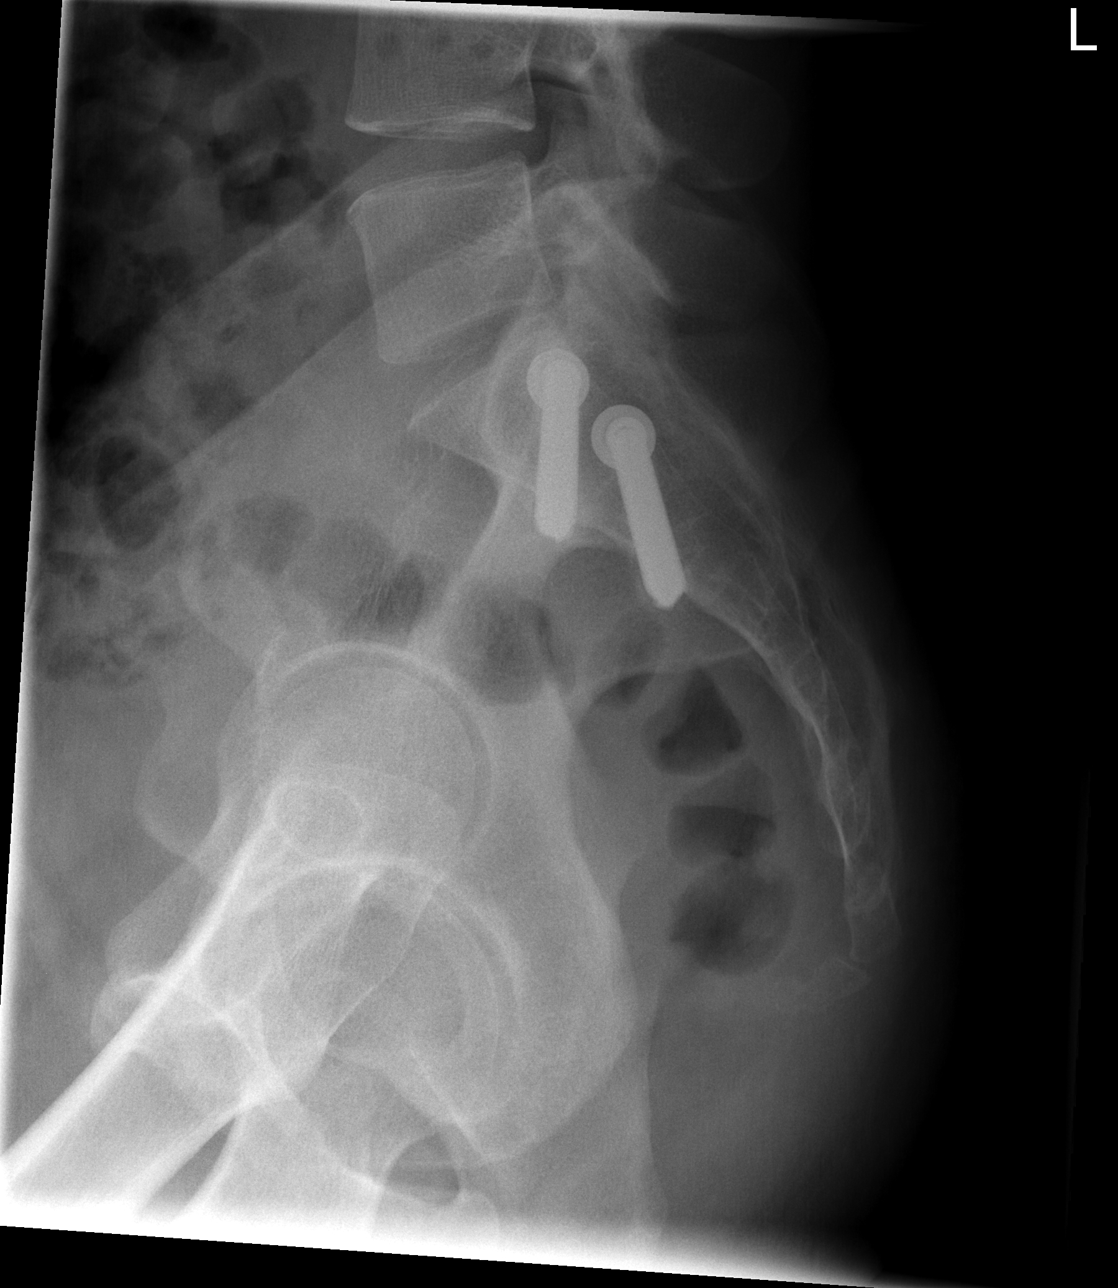

[5 of 5 positions shown; findings below may reference images not displayed]

FINDINGS: Frontal, lateral, spot lumbosacral lateral, and bilateral oblique
views were obtained. There are 5 non-rib-bearing lumbar type
vertebral bodies. T12 ribs are markedly hypoplastic. There is screw
fixation through both sacroiliac joints traversing the sacrum.
Alignment is anatomic these areas. There is no acute fracture or
spondylolisthesis. Disc spaces appear intact. There is no
appreciable facet arthropathy. Coils are present in the left upper
quadrant.
IMPRESSION: Screw fixation through both sacroiliac joints with alignment
anatomic. No acute fracture or spondylolisthesis. No appreciable
arthropathic change.
